# Patient Record
Sex: Male | Born: 1990 | Race: White | Hispanic: No | Smoking: Current every day smoker
Health system: Southern US, Community
[De-identification: ages and names within clinical notes are randomized; demographics above are authoritative.]

## PROBLEM LIST (undated history)

## (undated) ENCOUNTER — Emergency Department (HOSPITAL_COMMUNITY): Admission: EM | Payer: Self-pay | Source: Home / Self Care

## (undated) DIAGNOSIS — F419 Anxiety disorder, unspecified: Secondary | ICD-10-CM

## (undated) DIAGNOSIS — F32A Depression, unspecified: Secondary | ICD-10-CM

## (undated) DIAGNOSIS — Z720 Tobacco use: Secondary | ICD-10-CM

## (undated) DIAGNOSIS — N200 Calculus of kidney: Secondary | ICD-10-CM

## (undated) HISTORY — DX: Tobacco use: Z72.0

## (undated) HISTORY — DX: Calculus of kidney: N20.0

---

## 2017-11-28 ENCOUNTER — Emergency Department (HOSPITAL_COMMUNITY): Payer: Self-pay

## 2017-11-28 ENCOUNTER — Emergency Department (HOSPITAL_COMMUNITY)
Admission: EM | Admit: 2017-11-28 | Discharge: 2017-11-29 | Disposition: A | Discharge status: Home / Self Care | Payer: Self-pay | Source: Home / Self Care | Attending: Emergency Medicine | Admitting: Emergency Medicine

## 2017-11-28 ENCOUNTER — Encounter (HOSPITAL_COMMUNITY): Payer: Self-pay | Admitting: Emergency Medicine

## 2017-11-28 DIAGNOSIS — Y990 Civilian activity done for income or pay: Secondary | ICD-10-CM | POA: Insufficient documentation

## 2017-11-28 DIAGNOSIS — Z23 Encounter for immunization: Secondary | ICD-10-CM | POA: Insufficient documentation

## 2017-11-28 DIAGNOSIS — S61319A Laceration without foreign body of unspecified finger with damage to nail, initial encounter: Secondary | ICD-10-CM

## 2017-11-28 DIAGNOSIS — Y9289 Other specified places as the place of occurrence of the external cause: Secondary | ICD-10-CM

## 2017-11-28 DIAGNOSIS — Y9389 Activity, other specified: Secondary | ICD-10-CM | POA: Insufficient documentation

## 2017-11-28 DIAGNOSIS — S62634B Displaced fracture of distal phalanx of right ring finger, initial encounter for open fracture: Secondary | ICD-10-CM

## 2017-11-28 DIAGNOSIS — Z87891 Personal history of nicotine dependence: Secondary | ICD-10-CM

## 2017-11-28 DIAGNOSIS — W231XXA Caught, crushed, jammed, or pinched between stationary objects, initial encounter: Secondary | ICD-10-CM

## 2017-11-28 DIAGNOSIS — S6710XA Crushing injury of unspecified finger(s), initial encounter: Secondary | ICD-10-CM

## 2017-11-28 MED ORDER — CEFAZOLIN SODIUM-DEXTROSE 2-4 GM/100ML-% IV SOLN
2.0000 g | Freq: Once | INTRAVENOUS | Status: AC
  Administered 2017-11-28: 2 g via INTRAVENOUS
  Filled 2017-11-28: qty 100

## 2017-11-28 MED ORDER — BUPIVACAINE HCL (PF) 0.5 % IJ SOLN
10.0000 mL | Freq: Once | INTRAMUSCULAR | Status: AC
  Administered 2017-11-29: 10 mL

## 2017-11-28 MED ORDER — BUPIVACAINE HCL 0.25 % IJ SOLN
10.0000 mL | Freq: Once | INTRAMUSCULAR | Status: DC
  Filled 2017-11-28: qty 10

## 2017-11-28 MED ORDER — TETANUS-DIPHTH-ACELL PERTUSSIS 5-2.5-18.5 LF-MCG/0.5 IM SUSP
0.5000 mL | Freq: Once | INTRAMUSCULAR | Status: AC
  Administered 2017-11-28: 0.5 mL via INTRAMUSCULAR
  Filled 2017-11-28: qty 0.5

## 2017-11-28 MED ORDER — OXYCODONE-ACETAMINOPHEN 5-325 MG PO TABS
1.0000 | ORAL_TABLET | ORAL | Status: DC | PRN
  Administered 2017-11-28: 1 via ORAL
  Filled 2017-11-28: qty 1

## 2017-11-28 NOTE — ED Notes (Signed)
Patient transported to X-ray 

## 2017-11-28 NOTE — ED Triage Notes (Signed)
Reports getting right hand caught in a machine at work.  Lac to right ring finger.  Bleeding controlled.

## 2017-11-29 ENCOUNTER — Other Ambulatory Visit: Payer: Self-pay

## 2017-11-29 ENCOUNTER — Encounter (HOSPITAL_COMMUNITY): Payer: Self-pay | Admitting: *Deleted

## 2017-11-29 ENCOUNTER — Emergency Department (HOSPITAL_COMMUNITY): Payer: Worker's Compensation | Admitting: Anesthesiology

## 2017-11-29 ENCOUNTER — Ambulatory Visit (HOSPITAL_COMMUNITY)
Admission: EM | Admit: 2017-11-29 | Discharge: 2017-11-29 | Disposition: A | Discharge status: Home / Self Care | Payer: Worker's Compensation | Attending: Emergency Medicine | Admitting: Emergency Medicine

## 2017-11-29 ENCOUNTER — Encounter (HOSPITAL_COMMUNITY)
Admission: EM | Disposition: A | Discharge status: Home / Self Care | Payer: Self-pay | Source: Home / Self Care | Attending: Emergency Medicine

## 2017-11-29 DIAGNOSIS — S62634B Displaced fracture of distal phalanx of right ring finger, initial encounter for open fracture: Secondary | ICD-10-CM | POA: Insufficient documentation

## 2017-11-29 DIAGNOSIS — W230XXA Caught, crushed, jammed, or pinched between moving objects, initial encounter: Secondary | ICD-10-CM | POA: Insufficient documentation

## 2017-11-29 DIAGNOSIS — S67194A Crushing injury of right ring finger, initial encounter: Secondary | ICD-10-CM | POA: Insufficient documentation

## 2017-11-29 DIAGNOSIS — S61319A Laceration without foreign body of unspecified finger with damage to nail, initial encounter: Secondary | ICD-10-CM

## 2017-11-29 DIAGNOSIS — Y990 Civilian activity done for income or pay: Secondary | ICD-10-CM | POA: Insufficient documentation

## 2017-11-29 DIAGNOSIS — Z87891 Personal history of nicotine dependence: Secondary | ICD-10-CM | POA: Insufficient documentation

## 2017-11-29 HISTORY — PX: I&D EXTREMITY: SHX5045

## 2017-11-29 HISTORY — PX: OPEN REDUCTION INTERNAL FIXATION (ORIF) FINGER WITH RADIAL BONE GRAFT: SHX5666

## 2017-11-29 SURGERY — IRRIGATION AND DEBRIDEMENT EXTREMITY
Anesthesia: Monitor Anesthesia Care | Site: Finger | Laterality: Right

## 2017-11-29 MED ORDER — PHENYLEPHRINE 40 MCG/ML (10ML) SYRINGE FOR IV PUSH (FOR BLOOD PRESSURE SUPPORT)
PREFILLED_SYRINGE | INTRAVENOUS | Status: AC
  Filled 2017-11-29: qty 10

## 2017-11-29 MED ORDER — BUPIVACAINE HCL (PF) 0.25 % IJ SOLN
INTRAMUSCULAR | Status: AC
Start: 2017-11-29 — End: 2017-11-29
  Filled 2017-11-29: qty 30

## 2017-11-29 MED ORDER — PHENYLEPHRINE 40 MCG/ML (10ML) SYRINGE FOR IV PUSH (FOR BLOOD PRESSURE SUPPORT)
PREFILLED_SYRINGE | INTRAVENOUS | Status: DC | PRN
  Administered 2017-11-29 (×4): 80 ug via INTRAVENOUS

## 2017-11-29 MED ORDER — PROPOFOL 1000 MG/100ML IV EMUL
INTRAVENOUS | Status: AC
Start: 2017-11-29 — End: 2017-11-29
  Filled 2017-11-29: qty 100

## 2017-11-29 MED ORDER — MIDAZOLAM HCL 2 MG/2ML IJ SOLN
INTRAMUSCULAR | Status: AC
  Filled 2017-11-29: qty 2

## 2017-11-29 MED ORDER — 0.9 % SODIUM CHLORIDE (POUR BTL) OPTIME
TOPICAL | Status: DC | PRN
  Administered 2017-11-29: 1000 mL

## 2017-11-29 MED ORDER — ONDANSETRON HCL 4 MG/2ML IJ SOLN
INTRAMUSCULAR | Status: DC | PRN
  Administered 2017-11-29: 4 mg via INTRAVENOUS

## 2017-11-29 MED ORDER — CEFAZOLIN SODIUM-DEXTROSE 2-4 GM/100ML-% IV SOLN
2.0000 g | INTRAVENOUS | Status: DC
  Filled 2017-11-29: qty 100

## 2017-11-29 MED ORDER — FENTANYL CITRATE (PF) 100 MCG/2ML IJ SOLN
INTRAMUSCULAR | Status: DC | PRN
  Administered 2017-11-29 (×2): 50 ug via INTRAVENOUS

## 2017-11-29 MED ORDER — BUPIVACAINE HCL (PF) 0.25 % IJ SOLN
INTRAMUSCULAR | Status: DC | PRN
  Administered 2017-11-29: 5 mL

## 2017-11-29 MED ORDER — MIDAZOLAM HCL 5 MG/5ML IJ SOLN
INTRAMUSCULAR | Status: DC | PRN
  Administered 2017-11-29: 2 mg via INTRAVENOUS

## 2017-11-29 MED ORDER — CHLORHEXIDINE GLUCONATE 4 % EX LIQD: 60.0000 mL | Freq: Once | CUTANEOUS | Status: DC

## 2017-11-29 MED ORDER — LIDOCAINE HCL (PF) 1 % IJ SOLN
INTRAMUSCULAR | Status: AC
  Filled 2017-11-29: qty 30

## 2017-11-29 MED ORDER — LIDOCAINE HCL (PF) 4 % IJ SOLN
INTRAMUSCULAR | Status: AC
  Filled 2017-11-29: qty 5

## 2017-11-29 MED ORDER — OXYCODONE HCL 5 MG PO TABS: 5.0000 mg | ORAL_TABLET | Freq: Four times a day (QID) | ORAL | 0 refills | Status: DC | PRN

## 2017-11-29 MED ORDER — CEPHALEXIN 500 MG PO CAPS: 500.0000 mg | ORAL_CAPSULE | Freq: Two times a day (BID) | ORAL | 0 refills | Status: AC

## 2017-11-29 MED ORDER — LIDOCAINE HCL (PF) 1 % IJ SOLN
INTRAMUSCULAR | Status: DC | PRN
  Administered 2017-11-29: 5 mL

## 2017-11-29 MED ORDER — ONDANSETRON HCL 4 MG/2ML IJ SOLN
INTRAMUSCULAR | Status: AC
  Filled 2017-11-29: qty 2

## 2017-11-29 MED ORDER — FENTANYL CITRATE (PF) 250 MCG/5ML IJ SOLN
INTRAMUSCULAR | Status: AC
  Filled 2017-11-29: qty 5

## 2017-11-29 MED ORDER — PROPOFOL 500 MG/50ML IV EMUL
INTRAVENOUS | Status: DC | PRN
  Administered 2017-11-29: 125 ug/kg/min via INTRAVENOUS

## 2017-11-29 MED ORDER — CEPHALEXIN 500 MG PO CAPS: 500.0000 mg | ORAL_CAPSULE | Freq: Four times a day (QID) | ORAL | 0 refills | Status: AC

## 2017-11-29 MED ORDER — PROPOFOL 10 MG/ML IV BOLUS
INTRAVENOUS | Status: AC
  Filled 2017-11-29: qty 20

## 2017-11-29 MED ORDER — OXYCODONE HCL 5 MG PO TABS: 5.0000 mg | ORAL_TABLET | ORAL | 0 refills | Status: DC | PRN

## 2017-11-29 MED ORDER — LACTATED RINGERS IV SOLN
INTRAVENOUS | Status: DC
  Administered 2017-11-29: 10:00:00 via INTRAVENOUS

## 2017-11-29 SURGICAL SUPPLY — 32 items
BNDG COHESIVE 1X5 TAN STRL LF (GAUZE/BANDAGES/DRESSINGS) ×3 IMPLANT
BNDG CONFORM 2 STRL LF (GAUZE/BANDAGES/DRESSINGS) ×3 IMPLANT
BNDG ESMARK 4X9 LF (GAUZE/BANDAGES/DRESSINGS) ×3 IMPLANT
CORDS BIPOLAR (ELECTRODE) ×3 IMPLANT
COVER SURGICAL LIGHT HANDLE (MISCELLANEOUS) ×3 IMPLANT
CUFF TOURNIQUET SINGLE 18IN (TOURNIQUET CUFF) ×3 IMPLANT
DRAPE SURG 17X23 STRL (DRAPES) ×3 IMPLANT
GAUZE SPONGE 4X4 12PLY STRL (GAUZE/BANDAGES/DRESSINGS) ×3 IMPLANT
GAUZE XEROFORM 1X8 LF (GAUZE/BANDAGES/DRESSINGS) ×3 IMPLANT
GLOVE BIOGEL PI IND STRL 8.5 (GLOVE) ×1 IMPLANT
GLOVE BIOGEL PI INDICATOR 8.5 (GLOVE) ×2
GLOVE SURG ORTHO 8.0 STRL STRW (GLOVE) ×3 IMPLANT
GOWN STRL REUS W/ TWL LRG LVL3 (GOWN DISPOSABLE) ×1 IMPLANT
GOWN STRL REUS W/ TWL XL LVL3 (GOWN DISPOSABLE) ×1 IMPLANT
GOWN STRL REUS W/TWL LRG LVL3 (GOWN DISPOSABLE) ×2
GOWN STRL REUS W/TWL XL LVL3 (GOWN DISPOSABLE) ×2
K-WIRE .045X4 (WIRE) ×3 IMPLANT
KIT BASIN OR (CUSTOM PROCEDURE TRAY) ×3 IMPLANT
KIT ROOM TURNOVER OR (KITS) ×3 IMPLANT
NEEDLE HYPO 25GX1X1/2 BEV (NEEDLE) ×3 IMPLANT
NS IRRIG 1000ML POUR BTL (IV SOLUTION) ×3 IMPLANT
PACK ORTHO EXTREMITY (CUSTOM PROCEDURE TRAY) ×3 IMPLANT
PAD ARMBOARD 7.5X6 YLW CONV (MISCELLANEOUS) ×3 IMPLANT
SOAP 2 % CHG 4 OZ (WOUND CARE) ×3 IMPLANT
SPLINT FINGER (SOFTGOODS) ×3 IMPLANT
SUCTION FRAZIER HANDLE 10FR (MISCELLANEOUS) ×2
SUCTION TUBE FRAZIER 10FR DISP (MISCELLANEOUS) ×1 IMPLANT
SUT CHROMIC 5 0 P 3 (SUTURE) ×3 IMPLANT
SUT PROLENE 5 0 PS 2 (SUTURE) ×3 IMPLANT
SYR CONTROL 10ML LL (SYRINGE) ×3 IMPLANT
TOWEL OR 17X26 10 PK STRL BLUE (TOWEL DISPOSABLE) ×3 IMPLANT
UNDERPAD 30X30 (UNDERPADS AND DIAPERS) ×3 IMPLANT

## 2017-11-29 NOTE — ED Provider Notes (Signed)
[facility] EMERGENCY DEPARTMENT Provider Note   CSN: [id number] Arrival date & time: 11/28/17  2038     History   Chief Complaint Chief Complaint  Patient presents with  . Hand Injury    HPI RAD GRAMLING is a 27 y.o. male.  HPI   27 year old right-handed male presents with concern for crush injury and laceration to his right distal ring finger after it was caught in a machine at work.  Patient reports the incident occurred around 6:30 PM.  Reports he has tingling to the tip of his right ring finger.  Reports he is not sure if he is up-to-date on tetanus vaccinations.  Has had a history of prior injury to the right pinky finger.  Denies any other injuries.  History reviewed. No pertinent past medical history.  There are no active problems to display for this patient.   History reviewed. No pertinent surgical history.     Home Medications    Prior to Admission medications   Medication Sig Start Date End Date Taking? Authorizing Provider  Acetaminophen (TYLENOL PO) Take 1 tablet by mouth once.   Yes [provider]  cephALEXin (KEFLEX) 500 MG capsule Take 1 capsule (500 mg total) by mouth 2 (two) times daily for 7 days. 11/29/17 12/06/17  Alvira Monday, MD  oxyCODONE (ROXICODONE) 5 MG immediate release tablet Take 1 tablet (5 mg total) by mouth every 6 (six) hours as needed for severe pain. 11/29/17   Alvira Monday, MD    Family History No family history on file.  Social History Social History   Tobacco Use  . Smoking status: Former Games developer  . Smokeless tobacco: Never Used  Substance Use Topics  . Alcohol use: Yes    Comment: occasionally  . Drug use: Yes    Types: Methamphetamines    Comment: cbd oild     Allergies   Patient has no known allergies.   Review of Systems Review of Systems  Constitutional: Negative for fever.  HENT: Negative for sore throat.   Eyes: Negative for visual disturbance.  Respiratory: Negative for  shortness of breath.   Cardiovascular: Negative for chest pain.  Gastrointestinal: Negative for abdominal pain.  Musculoskeletal: Positive for arthralgias.  Skin: Positive for wound.  Neurological: Positive for numbness. Negative for syncope.     Physical Exam Updated Vital Signs BP (!) 126/106   Pulse 72   Temp 98.6 F (37 C) (Oral)   Resp 16   Ht 6' (1.829 m)   Wt 81.6 kg (180 lb)   SpO2 98%   BMI 24.41 kg/m   Physical Exam  Constitutional: He is oriented to person, place, and time. He appears well-developed and well-nourished. No distress.  HENT:  Head: Normocephalic and atraumatic.  Eyes: Conjunctivae and EOM are normal.  Neck: Normal range of motion.  Cardiovascular: Normal rate, regular rhythm and intact distal pulses.  Pulmonary/Chest: Effort normal. No respiratory distress.  Musculoskeletal: He exhibits no edema.       Right hand: He exhibits deformity and laceration. He exhibits normal capillary refill. Decreased sensation (to tip of ring finger, reports tingling) noted.  Crush injury to right ring finger Laceration through mid-nail with nail plate lifted through another laceration and laceration extending around side.  Tip of finger with cap refill, normal coloring.  Neurological: He is alert and oriented to person, place, and time.  Skin: Skin is warm and dry. He is not diaphoretic.  Nursing note and vitals reviewed.  ED Treatments / Results  Labs (all labs ordered are listed, but only abnormal results are displayed) Labs Reviewed - No data to display  EKG  EKG Interpretation None       Radiology Dg Finger Ring Right  Result Date: 11/28/2017 CLINICAL DATA:  27 y/o M; laceration to right ring finger after machine injury. EXAM: RIGHT RING FINGER 2+V COMPARISON:  None. FINDINGS: Comminuted acute fracture of the fourth distal phalanx tuft and distal shaft with soft tissue laceration. No joint dislocation. No other fracture identified. IMPRESSION:  Comminuted acute fracture of the fourth distal phalanx tuft and distal shaft with soft tissue laceration. No joint dislocation. Electronically Signed   By: Mitzi HansenLance  Furusawa-Stratton M.D.   On: 11/28/2017 21:41    Procedures Procedures (including critical care time)  Medications Ordered in ED Medications  oxyCODONE-acetaminophen (PERCOCET/ROXICET) 5-325 MG per tablet 1 tablet (1 tablet Oral Given 11/28/17 2049)  Tdap (BOOSTRIX) injection 0.5 mL (0.5 mLs Intramuscular Given 11/28/17 2250)  ceFAZolin (ANCEF) IVPB 2g/100 mL premix (0 g Intravenous Stopped 11/29/17 0000)  bupivacaine (MARCAINE) 0.5 % injection 10 mL (10 mLs Infiltration Given by Other 11/29/17 0128)     Initial Impression / Assessment and Plan / ED Course  I have reviewed the triage vital signs and the nursing notes.  Pertinent labs & imaging results that were available during my care of the patient were reviewed by me and considered in my medical decision making (see chart for details).     27 year old right-handed male presents with concern for crush injury and laceration to his right distal ring finger after it was caught in a machine at work.  X-ray shows distal phalanx and tuft fracture of the right ring finger.  Patient with significant lacerations, nailbed injury, and feel the degree of lacerations and injury are most appropriate for hand surgery repair.  Pt with normal capillary refill.   Given ancef and tetanus vaccination.  Discussed with Dr. Melvyn Novasrtmann who recommends the patient be discharged andd return to the emergency department at 8 AM for his evaluation and treatment.   Placed digital block with marcaine. Irrigated finger and placed xeroform dressing, bleeding controlled.  Gave rx for keflex and 8 tablets of oxycodone and recommend tylenol/ibuprofen primarily for pain.  Told to return in morning for eval by Dr. Melvyn Novasrtmann. Please page Dr. Melvyn Novasrtmann on arrival to ED.     Final Clinical Impressions(s) / ED Diagnoses   Final  diagnoses:  Open displaced fracture of distal phalanx of right ring finger, initial encounter  Crushing injury of finger, initial encounter  Laceration of nail bed of finger, initial encounter    ED Discharge Orders        Ordered    oxyCODONE (ROXICODONE) 5 MG immediate release tablet  Every 6 hours PRN     11/29/17 0037    cephALEXin (KEFLEX) 500 MG capsule  2 times daily     11/29/17 0037       Alvira MondaySchlossman, Taiki Buckwalter, MD 11/29/17 0157

## 2017-11-29 NOTE — Anesthesia Procedure Notes (Signed)
Procedure Name: MAC Date/Time: 11/29/2017 10:46 AM Performed by: Kyung Rudd, CRNA Pre-anesthesia Checklist: Patient identified, Emergency Drugs available, Suction available and Patient being monitored Patient Re-evaluated:Patient Re-evaluated prior to induction Oxygen Delivery Method: Simple face mask Induction Type: IV induction Placement Confirmation: positive ETCO2

## 2017-11-29 NOTE — Anesthesia Postprocedure Evaluation (Signed)
Anesthesia Post Note  Patient: [name]  Procedure(s) Performed: IRRIGATION AND DEBRIDEMENT RING FINGER REPAIR (Right Finger) OPEN REDUCTION INTERNAL FIXATION (ORIF) FINGER (Right Finger)     Patient location during evaluation: PACU Anesthesia Type: MAC Level of consciousness: awake and alert Pain management: pain level controlled Vital Signs Assessment: post-procedure vital signs reviewed and stable Respiratory status: spontaneous breathing, nonlabored ventilation, respiratory function stable and patient connected to nasal cannula oxygen Cardiovascular status: stable and blood pressure returned to baseline Postop Assessment: no apparent nausea or vomiting Anesthetic complications: no    Last Vitals:  Vitals:   11/29/17 1145 11/29/17 1200  BP: 112/75 110/74  Pulse: 72 60  Resp: 14 15  Temp:  36.6 C  SpO2: 99% 99%    Last Pain:  Vitals:   11/29/17 0824  TempSrc: Oral                 Derek Quinn

## 2017-11-29 NOTE — ED Triage Notes (Signed)
Pt in after crush injury to his right hand last night, seen last night and discharged, told to return this am to see Dr. Melvyn Novasrtmann. Pt denies pain at this time.

## 2017-11-29 NOTE — H&P (Signed)
Derek Quinn is an 27 y.o. male.   Chief Complaint: Right ring finger injury HPI: 27 year old right-handed male presents with concern for crush injury and laceration to his right distal ring finger after it was caught in a machine at work.  Patient reports the incident occurred around 6:30 PM.  Reports he has tingling to the tip of his right ring finger.  Reports he is not sure if he is up-to-date on tetanus vaccinations.  Has had a history of prior injury to the right pinky finger.  Denies any other injuries.TD given in ED  History reviewed. No pertinent past medical history.  History reviewed. No pertinent surgical history.  History reviewed. No pertinent family history. Social History:  reports that he has quit smoking. he has never used smokeless tobacco. He reports that he drinks alcohol. He reports that he uses drugs. Drug: Methamphetamines.  Allergies: No Known Allergies  Medications Prior to Admission  Medication Sig Dispense Refill  . cephALEXin (KEFLEX) 500 MG capsule Take 1 capsule (500 mg total) by mouth 2 (two) times daily for 7 days. (Patient not taking: Reported on 11/29/2017) 14 capsule 0  . oxyCODONE (ROXICODONE) 5 MG immediate release tablet Take 1 tablet (5 mg total) by mouth every 6 (six) hours as needed for severe pain. (Patient not taking: Reported on 11/29/2017) 8 tablet 0    No results found for this or any previous visit (from the past 48 hour(s)). Dg Finger Ring Right  Result Date: 11/28/2017 CLINICAL DATA:  27 y/o M; laceration to right ring finger after machine injury. EXAM: RIGHT RING FINGER 2+V COMPARISON:  None. FINDINGS: Comminuted acute fracture of the fourth distal phalanx tuft and distal shaft with soft tissue laceration. No joint dislocation. No other fracture identified. IMPRESSION: Comminuted acute fracture of the fourth distal phalanx tuft and distal shaft with soft tissue laceration. No joint dislocation. Electronically Signed   By: Mitzi HansenLance  Furusawa-Stratton  M.D.   On: 11/28/2017 21:41    ROS : NO RECENT ILLNESSES OR HOSPITALIZATIONS  Blood pressure 112/76, pulse 83, temperature 97.7 F (36.5 C), temperature source Oral, resp. rate 18, height 6' (1.829 m), weight 180 lb (81.6 kg), SpO2 97 %. Physical Exam  General Appearance:  Alert, cooperative, no distress, appears stated age  Head:  Normocephalic, without obvious abnormality, atraumatic  Eyes:  Pupils equal, conjunctiva/corneas clear,         Throat: Lips, mucosa, and tongue normal; teeth and gums normal  Neck: No visible masses     Lungs:   respirations unlabored  Chest Wall:  No tenderness or deformity  Heart:  Regular rate and rhythm,  Abdomen:   Soft, non-tender,         Extremities: RIGHT HAND: SIGNIFICANT NAIL BED INJURY TO RIGHT RING FINGER, FINGER WARM WELL PERFUSED GOOD CAP REFIL NO WOUNDS TO LONG/INDEX/SMALL   Pulses: 2+ and symmetric  Skin: Skin color, texture, turgor normal, no rashes or lesions     Neurologic: Normal    Assessment/Plan RIGHT RING OPEN DISTAL PHALANX FRACTURE WITH SOFT TISSUE DISARRAY  RIGHT RING FINGER OPEN REDUCTION AND REPAIR AS INDICATED  R/B/A DISCUSSED WITH PT IN HOSPITAL  PT VOICED UNDERSTANDING OF PLAN CONSENT SIGNED DAY OF SURGERY PT SEEN AND EXAMINED PRIOR TO OPERATIVE PROCEDURE/DAY OF SURGERY SITE MARKED. QUESTIONS ANSWERED WILL GO HOME FOLLOWING SURGERY   WE ARE PLANNING SURGERY FOR YOUR UPPER EXTREMITY. THE RISKS AND BENEFITS OF SURGERY INCLUDE BUT NOT LIMITED TO BLEEDING INFECTION, DAMAGE TO NEARBY NERVES ARTERIES TENDONS,  FAILURE OF SURGERY TO ACCOMPLISH ITS INTENDED GOALS, PERSISTENT SYMPTOMS AND NEED FOR FURTHER SURGICAL INTERVENTION. WITH THIS IN MIND WE WILL PROCEED. I HAVE DISCUSSED WITH THE PATIENT THE PRE AND POSTOPERATIVE REGIMEN AND THE DOS AND DON'TS. PT VOICED UNDERSTANDING AND INFORMED CONSENT SIGNED.  Derek Quinn 11/29/2017, 10:33 AM

## 2017-11-29 NOTE — ED Notes (Signed)
Report given to Robbie RN.

## 2017-11-29 NOTE — Anesthesia Preprocedure Evaluation (Addendum)
Anesthesia Evaluation  Patient identified by MRN, date of birth, ID band Patient awake    Reviewed: Allergy & Precautions, NPO status , Patient's Chart, lab work & pertinent test results  History of Anesthesia Complications Negative for: history of anesthetic complications  Airway Mallampati: I  TM Distance: >3 FB Neck ROM: Full    Dental  (+) Teeth Intact, Dental Advisory Given   Pulmonary neg shortness of breath, neg sleep apnea, neg COPD, neg recent URI, former smoker,    breath sounds clear to auscultation       Cardiovascular negative cardio ROS   Rhythm:Regular     Neuro/Psych negative neurological ROS  negative psych ROS   GI/Hepatic negative GI ROS, Neg liver ROS,   Endo/Other  negative endocrine ROS  Renal/GU negative Renal ROS     Musculoskeletal   Abdominal   Peds  Hematology negative hematology ROS (+)   Anesthesia Other Findings   Reproductive/Obstetrics                            Anesthesia Physical Anesthesia Plan  ASA: I  Anesthesia Plan: MAC   Post-op Pain Management:    Induction: Intravenous  PONV Risk Score and Plan: 1 and Ondansetron  Airway Management Planned: Nasal Cannula  Additional Equipment: None  Intra-op Plan:   Post-operative Plan:   Informed Consent: I have reviewed the patients History and Physical, chart, labs and discussed the procedure including the risks, benefits and alternatives for the proposed anesthesia with the patient or authorized representative who has indicated his/her understanding and acceptance.   Dental advisory given  Plan Discussed with: CRNA and Surgeon  Anesthesia Plan Comments:         Anesthesia Quick Evaluation

## 2017-11-29 NOTE — Discharge Instructions (Signed)
KEEP BANDAGE CLEAN AND DRY CALL OFFICE FOR F/U APPT [phone] IN 6 DAYS NO USE OF RIGHT RING FINGER  KEEP HAND ELEVATED ABOVE HEART OK TO APPLY ICE TO OPERATIVE AREA CONTACT OFFICE IF ANY WORSENING PAIN OR CONCERNS.

## 2017-11-29 NOTE — Transfer of Care (Signed)
Immediate Anesthesia Transfer of Care Note  Patient: Derek Quinn  Procedure(s) Performed: IRRIGATION AND DEBRIDEMENT RING FINGER REPAIR (Right Finger)  Patient Location: PACU  Anesthesia Type:MAC  Level of Consciousness: awake, alert  and oriented  Airway & Oxygen Therapy: Patient Spontanous Breathing and Patient connected to face mask oxygen  Post-op Assessment: Report given to RN, Post -op Vital signs reviewed and stable and Patient moving all extremities  Post vital signs: Reviewed and stable  Last Vitals:  Vitals:   11/29/17 0824 11/29/17 1139  BP: 112/76 121/65  Pulse: 83 71  Resp: 18 (!) 9  Temp: 36.5 C (!) 36.4 C  SpO2: 97% 100%    Last Pain:  Vitals:   11/29/17 0824  TempSrc: Oral         Complications: No apparent anesthesia complications

## 2017-11-29 NOTE — Op Note (Signed)
PREOPERATIVE DIAGNOSIS: Right ring finger open distal phalanx fracture  POSTOPERATIVE DIAGNOSIS: Same  ATTENDING SURGEON: Dr. Bradly BienenstockFred Elisheba Mcdonnell who was scrubbed and present for the entire procedure  ASSISTANT SURGEON: None  ANESTHESIA: 1% Xylocaine quarter percent Marcaine local block with IV sedation  OPERATIVE PROCEDURE: #1: Open treatment of right ring finger distal phalanx fracture requiring internal fixation #2: Open debridement of right ring finger open distal phalanx fracture with excisional debridement of skin subcutaneous tissue and bone #3: Repair right ring finger nail bed #4: Traumatic wound laceration repair, 1 cm and separate wound measuring 1 cm #5: Radiographs 3 views right ring finger   IMPLANTS:1 0.045 K wire   RADIOGRAPHIC INTERPRETATION:AP lateral oblique views of the finger do show the K wire fixation across the distal interphalangeal joint with good position   SURGICAL INDICATIONS:Patient is a right-hand-dominant gentleman sustained the open distal phalanx fracture. Patient was seen and evaluated in the hospital recommended undergo the above procedure. Risks benefits and alternatives were discussed in detail with the patient in a signed informed consent was obtained. Risks include but not limited to bleeding infection damage to nearby nerves arteries or tendons loss of motion of the wrists and digits incomplete relief of symptoms and need for further surgical intervention   SURGICAL TECHNIQUE:Patient is properly identified in the preoperative holding area and a mark with a permanent marker made on the right ring finger to indicate correct operative site. Patient then brought back to operating room placed supine on anesthesia and table where the IV sedation was administered. The local anesthetic was administered. A well-padded tourniquet was then placed on the right forearm and sealed with the appropriate drape. The right upper extremity is then prepped and draped in normal  sterile fashion. Timeout was called correct site  was identified and the procedure was then begun. Attention was then turned to the ring finger. What remained of the nail plate was then removed with a small Therapist, nutritionalreer elevator. The fracture site was then opened up and excisional debridement of skin subcutaneous tissue and bone was then carried out sharply with a small curettes and knife. The wound was then thoroughly irrigated thorough irrigation was then done of the fracture site. After wound irrigation the fracture was then reduced. A 0.045 K wire was then placed retrograde across the fracture site in the distal interphalangeal joint for additional purchase. Following this after open treatment of the fracture the nail bed was then repaired using 5-0 chromic sutures. The traumatic laceration was then repaired 2 separate lacerations to the finger which were repaired with simple Prolene sutures. Portion the nail plate was then placed beneath the eponychia to protect the repair of the nailbed. Tourniquet deflated good perfusion of the finger. Xeroform dressing a sterile compressive bandage then applied. Patient is then placed in a finger splint taken recovery room in good condition.   POSTOPERATIVE PLAN: Patient be discharged to home seen back now office in 6 days for wound check down to see her therapist for a tip protector splint see him back at the four-week mark for suture removal and pin removal begin some more aggressive therapy at that next visit

## 2017-11-29 NOTE — ED Provider Notes (Signed)
[facility] EMERGENCY DEPARTMENT Provider Note   CSN: [id number] Arrival date & time: 11/29/17  4098     History   Chief Complaint Chief Complaint  Patient presents with  . Hand Injury    HPI Derek Quinn is a 27 y.o. male presents today for follow-up of right ring finger injury.He sustained a crush injury and laceration to his right distal ring finger after it was caught in a machine at work.  Radiographs show a comminuted acute fracture of the fourth distal phalanx tuft and distal shaft with overlying laceration.Patient received a digital block in the ED yesterday  With significant relief of pain.  He denies excessive bleeding through the dressing, fevers, or chills.  Given degree of lacerations and nail bed injury, Dr. Orlan Leavens with hand surgery was consulted who recommended discharge and return in the morning for repair.  The history is provided by the patient.    History reviewed. No pertinent past medical history.  There are no active problems to display for this patient.   History reviewed. No pertinent surgical history.     Home Medications    Prior to Admission medications   Medication Sig Start Date End Date Taking? Authorizing Provider  cephALEXin (KEFLEX) 500 MG capsule Take 1 capsule (500 mg total) by mouth 2 (two) times daily for 7 days. Patient not taking: Reported on 11/29/2017 11/29/17 12/06/17  Alvira Monday, MD  oxyCODONE (ROXICODONE) 5 MG immediate release tablet Take 1 tablet (5 mg total) by mouth every 6 (six) hours as needed for severe pain. Patient not taking: Reported on 11/29/2017 11/29/17   Alvira Monday, MD    Family History History reviewed. No pertinent family history.  Social History Social History   Tobacco Use  . Smoking status: Former Games developer  . Smokeless tobacco: Never Used  Substance Use Topics  . Alcohol use: Yes    Comment: occasionally  . Drug use: Yes    Types: Methamphetamines    Comment: cbd oild      Allergies   Patient has no known allergies.   Review of Systems Review of Systems  Constitutional: Negative for chills and fever.  Musculoskeletal: Positive for arthralgias.  Skin: Positive for wound.  Neurological: Positive for numbness (secondary to digital block).     Physical Exam Updated Vital Signs BP 112/76 (BP Location: Left Arm)   Pulse 83   Temp 97.7 F (36.5 C) (Oral)   Resp 18   Ht 6' (1.829 m)   Wt 81.6 kg (180 lb)   SpO2 97%   BMI 24.41 kg/m   Physical Exam  Constitutional: He appears well-developed and well-nourished. No distress.  HENT:  Head: Normocephalic and atraumatic.  Eyes: Conjunctivae are normal. Right eye exhibits no discharge. Left eye exhibits no discharge.  Neck: No JVD present. No tracheal deviation present.  Cardiovascular: Normal rate and intact distal pulses.  2+ radial pulses bilaterally  Pulmonary/Chest: Effort normal.  Abdominal: He exhibits no distension.  Musculoskeletal: He exhibits no edema.  Large dressing and finger splint applied to the right fourth digit.  5/5 strength of wrist and digits with flexion and extension against resistance.  Good grip strength bilaterally.  Neurological: He is alert.  Fluent speech, no facial droop, sensation intact to soft touch of bilateral hands  Skin: Skin is warm and dry. Capillary refill takes less than 2 seconds. No erythema.  Psychiatric: He has a normal mood and affect. His behavior is normal.  Nursing note and vitals reviewed.  ED Treatments / Results  Labs (all labs ordered are listed, but only abnormal results are displayed) Labs Reviewed - No data to display  EKG  EKG Interpretation None       Radiology Dg Finger Ring Right  Result Date: 11/28/2017 CLINICAL DATA:  27 y/o M; laceration to right ring finger after machine injury. EXAM: RIGHT RING FINGER 2+V COMPARISON:  None. FINDINGS: Comminuted acute fracture of the fourth distal phalanx tuft and distal shaft with  soft tissue laceration. No joint dislocation. No other fracture identified. IMPRESSION: Comminuted acute fracture of the fourth distal phalanx tuft and distal shaft with soft tissue laceration. No joint dislocation. Electronically Signed   By: Mitzi HansenLance  Furusawa-Stratton M.D.   On: 11/28/2017 21:41    Procedures Procedures (including critical care time)  Medications Ordered in ED Medications - No data to display   Initial Impression / Assessment and Plan / ED Course  I have reviewed the triage vital signs and the nursing notes.  Pertinent labs & imaging results that were available during my care of the patient were reviewed by me and considered in my medical decision making (see chart for details).     Patient presents for reevaluation of right ring finger injury as instructed.  Afebrile, vital signs are stable.  He is intact in no apparent distress.  Wound was dressed appropriately and splint remains in place.  No evidence of compartment syndrome or secondary infection.  Spoke with Dr. Orlan Leavensrtman who states that he will take the patient to the operating room for repair.  Patient has been n.p.o. since yesterday afternoon.  Final Clinical Impressions(s) / ED Diagnoses   Final diagnoses:  Crushing injury of right ring finger, initial encounter  Open displaced fracture of distal phalanx of right ring finger, initial encounter  Laceration of nail bed of finger, initial encounter    ED Discharge Orders    None       Jeanie SewerFawze, Dashawn Bartnick A, PA-C 11/29/17 1621    Arby BarrettePfeiffer, Marcy, MD 11/29/17 1730

## 2017-12-01 ENCOUNTER — Encounter (HOSPITAL_COMMUNITY): Payer: Self-pay | Admitting: Orthopedic Surgery

## 2017-12-02 ENCOUNTER — Encounter (HOSPITAL_COMMUNITY): Payer: Self-pay | Admitting: Orthopedic Surgery

## 2018-08-31 IMAGING — DX DG FINGER RING 2+V*R*
3 series · 3 of 3 positions shown · non-contrast
Comparison: None.

CLINICAL DATA: 26 y/o M; laceration to right ring finger after
machine injury.

EXAM:
RIGHT RING FINGER 2+V

[finger ap]
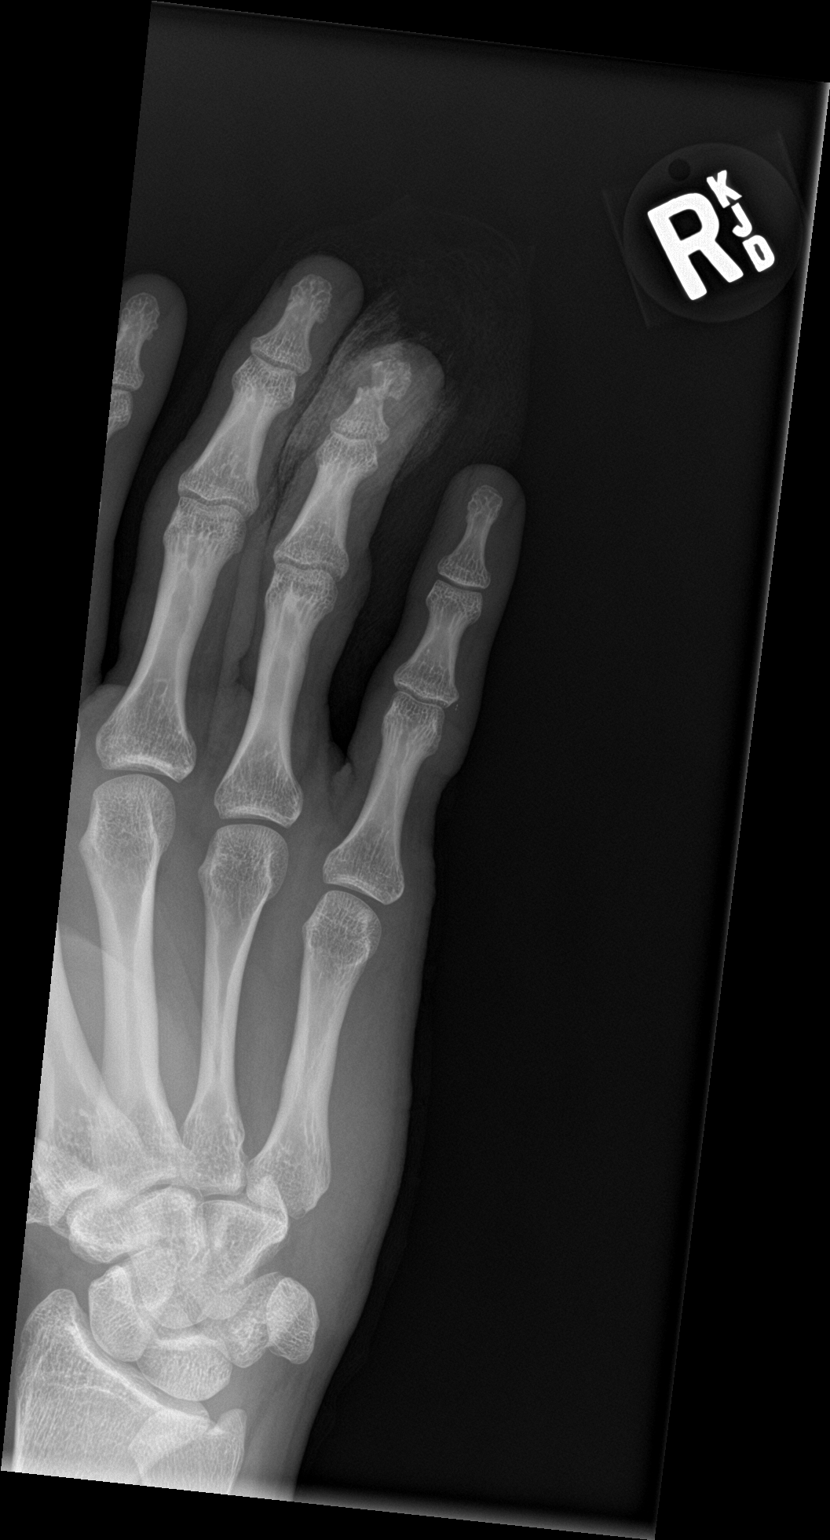

[finger obl]
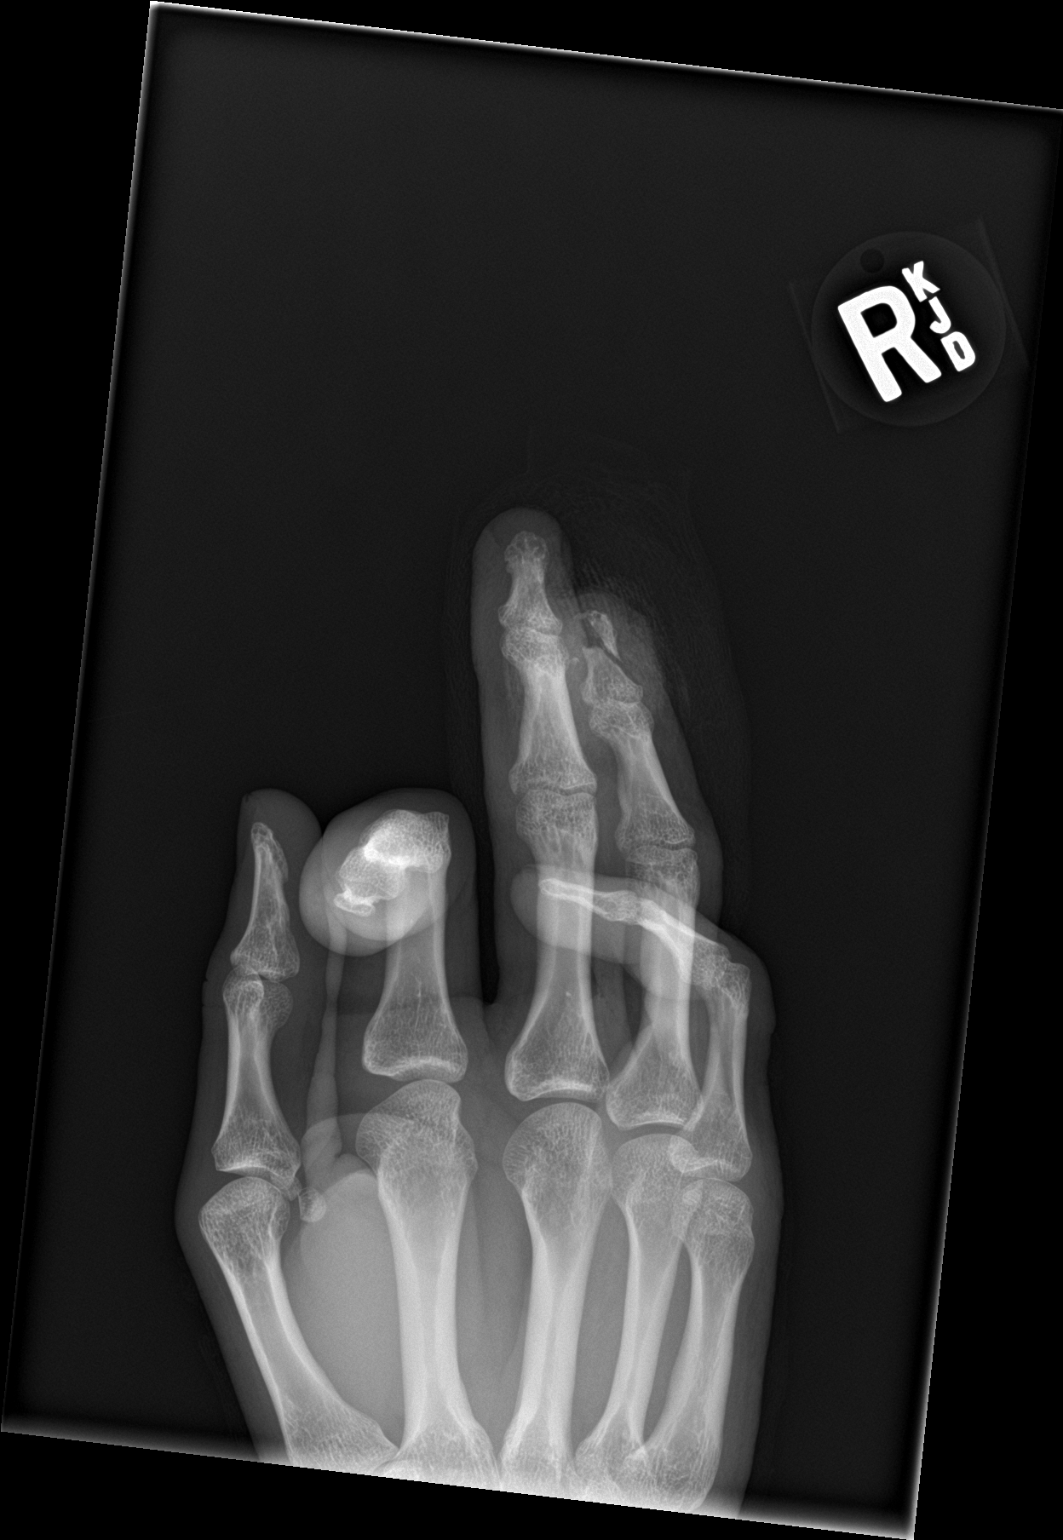

[finger lat]
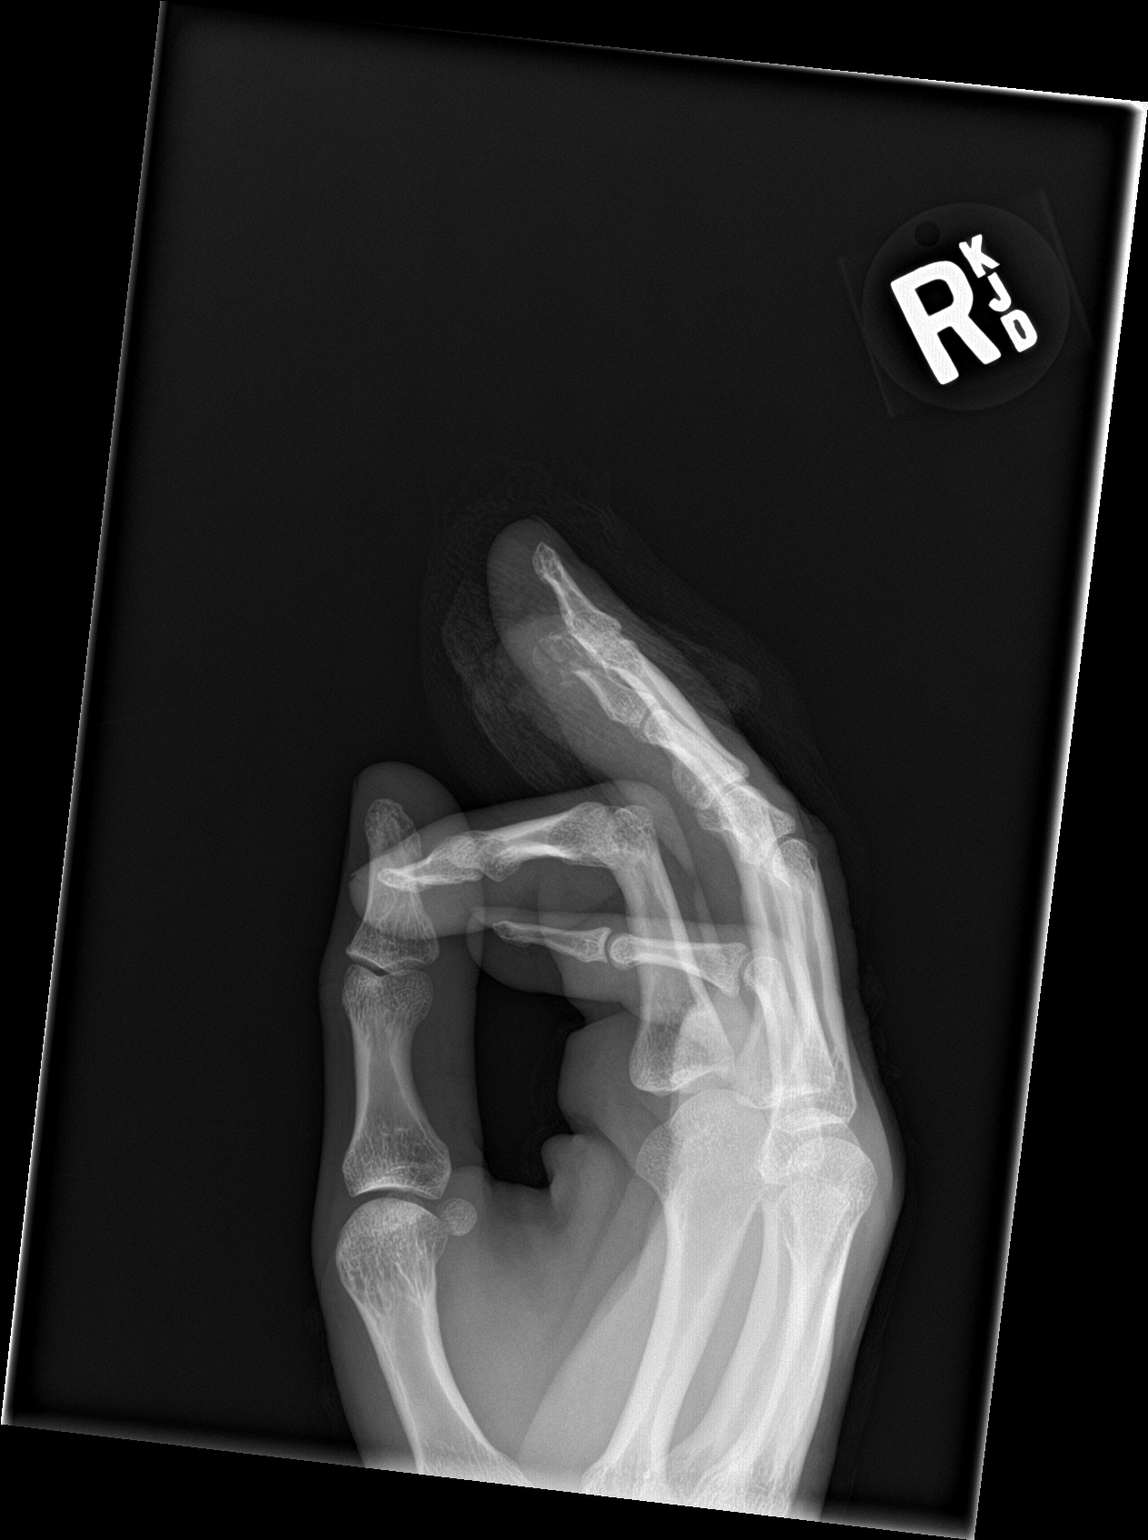

[3 of 3 positions shown; findings below may reference images not displayed]

FINDINGS: Comminuted acute fracture of the fourth distal phalanx tuft and
distal shaft with soft tissue laceration. No joint dislocation. No
other fracture identified.
IMPRESSION: Comminuted acute fracture of the fourth distal phalanx tuft and
distal shaft with soft tissue laceration. No joint dislocation.

By: Evome Zenullahi M.D.

## 2019-11-04 ENCOUNTER — Ambulatory Visit: Payer: Self-pay | Admitting: Family Medicine

## 2024-05-06 ENCOUNTER — Ambulatory Visit: Payer: Self-pay | Admitting: Family Medicine

## 2024-06-25 ENCOUNTER — Ambulatory Visit (HOSPITAL_BASED_OUTPATIENT_CLINIC_OR_DEPARTMENT_OTHER): Admit: 2024-06-25 | Discharge: 2024-06-25 | Disposition: A | Discharge status: Home / Self Care | Admitting: Radiology

## 2024-06-25 ENCOUNTER — Encounter (HOSPITAL_BASED_OUTPATIENT_CLINIC_OR_DEPARTMENT_OTHER): Payer: Self-pay

## 2024-06-25 ENCOUNTER — Ambulatory Visit (INDEPENDENT_AMBULATORY_CARE_PROVIDER_SITE_OTHER): Admitting: Radiology

## 2024-06-25 ENCOUNTER — Ambulatory Visit (HOSPITAL_BASED_OUTPATIENT_CLINIC_OR_DEPARTMENT_OTHER): Admission: EM | Admit: 2024-06-25 | Discharge: 2024-06-25 | Disposition: A | Discharge status: Home / Self Care

## 2024-06-25 DIAGNOSIS — N201 Calculus of ureter: Secondary | ICD-10-CM

## 2024-06-25 DIAGNOSIS — H6123 Impacted cerumen, bilateral: Secondary | ICD-10-CM

## 2024-06-25 DIAGNOSIS — R1011 Right upper quadrant pain: Secondary | ICD-10-CM

## 2024-06-25 DIAGNOSIS — N1330 Unspecified hydronephrosis: Secondary | ICD-10-CM

## 2024-06-25 DIAGNOSIS — R112 Nausea with vomiting, unspecified: Secondary | ICD-10-CM

## 2024-06-25 DIAGNOSIS — R1031 Right lower quadrant pain: Secondary | ICD-10-CM

## 2024-06-25 DIAGNOSIS — R10A1 Flank pain, right side: Secondary | ICD-10-CM

## 2024-06-25 DIAGNOSIS — R1032 Left lower quadrant pain: Secondary | ICD-10-CM

## 2024-06-25 HISTORY — DX: Anxiety disorder, unspecified: F41.9

## 2024-06-25 HISTORY — DX: Depression, unspecified: F32.A

## 2024-06-25 MED ORDER — TAMSULOSIN HCL 0.4 MG PO CAPS: 0.4000 mg | ORAL_CAPSULE | Freq: Every day | ORAL | 0 refills | Status: AC

## 2024-06-25 MED ORDER — OXYCODONE HCL 5 MG PO TABS: 5.0000 mg | ORAL_TABLET | Freq: Four times a day (QID) | ORAL | 0 refills | Status: AC | PRN

## 2024-06-25 NOTE — ED Triage Notes (Signed)
 Patient presents to [facility] for lower back pain since last Thursday. Seen in the ED on Sunday. He was prescribed Flomax, Zofran , and Oxy. States pain has worsened moving from bladder to flank area. Appt scheduled next Thursday for urology. Requesting work note to be out until next Thursday. He also reports voiding some, but believes he is retaining urine.   Denies fever.

## 2024-06-25 NOTE — ED Provider Notes (Addendum)
 Derek Quinn [facility]    CSN: [id number] Arrival date & time: 06/25/24  0859      History   Chief Complaint Chief Complaint  Patient presents with   Flank Pain    HPI Derek Quinn is a 33 y.o. male.   33 year old male who was seen at [facility] emergency department on 06/20/2024 for abdominal pain with nausea and vomiting.  His pain started on 06/18/2024 or earlier.  CT scan showed a right ureteral stone mid ureter that was 2 to 3 mm in size.  He was started on Zofran  for nausea and vomiting, oxycodone  (5 mg, #10) for pain and tamsulosin (0.4 mg, #10) to try to help pass the stone.  He has an appointment with Germain Brothers, MD, on 07/01/2024.  He does not feel able to work due to pain and nausea and vomiting.  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  He is also concerned that he might be retaining urine.     Flank Pain Associated symptoms include abdominal pain. Pertinent negatives include no chest pain.    Past Medical History:  Diagnosis Date   Anxiety    Depression     There are no active problems to display for this patient.   Past Surgical History:  Procedure Laterality Date   I & D EXTREMITY Right 11/29/2017   Procedure: IRRIGATION AND DEBRIDEMENT RING FINGER REPAIR;  Surgeon: Shari Easter, MD;  Location: [facility] OR;  Service: Orthopedics;  Laterality: Right;   OPEN REDUCTION INTERNAL FIXATION (ORIF) FINGER WITH RADIAL BONE GRAFT Right 11/29/2017   Procedure: OPEN REDUCTION INTERNAL FIXATION (ORIF) FINGER;  Surgeon: Shari Easter, MD;  Location: [facility] OR;  Service: Orthopedics;  Laterality: Right;       Home Medications    Prior to Admission medications   Medication Sig Start Date End Date Taking? Authorizing Provider  DULoxetine (CYMBALTA) 30 MG capsule Take 60 mg by mouth daily. 05/17/24  Yes [provider]  ondansetron  (ZOFRAN -ODT) 4 MG disintegrating tablet SMARTSIG:4 Milligram(s) By Mouth Every 6 Hours PRN 06/20/24  Yes [provider]  oxyCODONE  (OXY IR/ROXICODONE ) 5 MG immediate release tablet Take 1 tablet (5 mg total) by mouth every 6 (six) hours as needed for severe pain (pain score 7-10). 06/25/24   Ival Domino, FNP  tamsulosin (FLOMAX) 0.4 MG CAPS capsule Take 1 capsule (0.4 mg total) by mouth daily after supper. 06/25/24   Ival Domino, FNP    Family History History reviewed. No pertinent family history.  Social History Social History   Tobacco Use   Smoking status: Every Day    Types: Cigarettes   Smokeless tobacco: Never  Vaping Use   Vaping status: Every Day  Substance Use Topics   Alcohol use: Yes    Comment: occasionally   Drug use: Yes    Types: Methamphetamines    Comment: cbd oild     Allergies   Patient has no known allergies.   Review of Systems Review of Systems  Constitutional:  Negative for chills and fever.  HENT:  Negative for ear pain and sore throat.   Eyes:  Negative for pain and visual disturbance.  Respiratory:  Negative for cough.   Cardiovascular:  Negative for chest pain and palpitations.  Gastrointestinal:  Positive for abdominal pain, nausea and vomiting. Negative for constipation and diarrhea.  Genitourinary:  Positive for flank pain. Negative for dysuria and hematuria.  Musculoskeletal:  Negative for arthralgias and back pain.  Skin:  Negative for color  change and rash.  Neurological:  Negative for seizures and syncope.  All other systems reviewed and are negative.    Physical Exam Triage Vital Signs ED Triage Vitals  Encounter Vitals Group     BP 06/25/24 0919 121/85     Girls Systolic BP Percentile --      Girls Diastolic BP Percentile --      Boys Systolic BP Percentile --      Boys Diastolic BP Percentile --      Pulse Rate 06/25/24 0919 86     Resp 06/25/24 0919 18     Temp 06/25/24 0919 98.6 F (37 C)     Temp Source 06/25/24 0919 Oral     SpO2 --      Weight --      Height --      Head Circumference --      Peak Flow --      Pain Score  06/25/24 0911 2     Pain Loc --      Pain Education --      Exclude from Growth Chart --    No data found.  Updated Vital Signs BP 121/85 (BP Location: Right Arm)   Pulse 86   Temp 98.6 F (37 C) (Oral)   Resp 18   Visual Acuity Right Eye Distance:   Left Eye Distance:   Bilateral Distance:    Right Eye Near:   Left Eye Near:    Bilateral Near:     Physical Exam Vitals and nursing note reviewed.  Constitutional:      General: He is not in acute distress.    Appearance: He is well-developed. He is not ill-appearing or toxic-appearing.  HENT:     Head: Normocephalic and atraumatic.     Right Ear: Hearing and external ear normal. There is impacted cerumen.     Left Ear: Hearing and external ear normal. There is impacted cerumen.     Ears:     Comments: Both ears are completely blocked with wax.    Nose: No congestion or rhinorrhea.     Right Sinus: No maxillary sinus tenderness or frontal sinus tenderness.     Left Sinus: No maxillary sinus tenderness or frontal sinus tenderness.     Mouth/Throat:     Lips: Pink.     Mouth: Mucous membranes are moist.     Pharynx: Uvula midline. No oropharyngeal exudate or posterior oropharyngeal erythema.     Tonsils: No tonsillar exudate.  Eyes:     Conjunctiva/sclera: Conjunctivae normal.     Pupils: Pupils are equal, round, and reactive to light.  Cardiovascular:     Rate and Rhythm: Normal rate and regular rhythm.     Heart sounds: S1 normal and S2 normal. No murmur heard. Pulmonary:     Effort: Pulmonary effort is normal. No respiratory distress.     Breath sounds: Normal breath sounds. No decreased breath sounds, wheezing, rhonchi or rales.  Abdominal:     General: Bowel sounds are normal.     Palpations: Abdomen is soft.     Tenderness: There is abdominal tenderness (Mild to moderate abdominal pain) in the right upper quadrant, right lower quadrant, suprapubic area and left lower quadrant. There is right CVA tenderness.  There is no left CVA tenderness, guarding or rebound. Negative signs include Murphy's sign, Rovsing's sign and McBurney's sign.  Musculoskeletal:        General: No swelling.     Cervical back: Neck supple.  Lymphadenopathy:     Head:     Right side of head: No submental, submandibular, tonsillar, preauricular or posterior auricular adenopathy.     Left side of head: No submental, submandibular, tonsillar, preauricular or posterior auricular adenopathy.     Cervical: No cervical adenopathy.     Right cervical: No superficial cervical adenopathy.    Left cervical: No superficial cervical adenopathy.  Skin:    General: Skin is warm and dry.     Capillary Refill: Capillary refill takes less than 2 seconds.     Findings: No rash.  Neurological:     Mental Status: He is alert and oriented to person, place, and time.  Psychiatric:        Mood and Affect: Mood normal.      UC Treatments / Results  Labs (all labs ordered are listed, but only abnormal results are displayed) Labs Reviewed - No data to display  EKG   Radiology US  Renal Result Date: 06/25/2024 CLINICAL DATA:  Right flank pain. EXAM: RENAL / URINARY TRACT ULTRASOUND COMPLETE COMPARISON:  June 20, 2024. FINDINGS: Right Kidney: Renal measurements: 10.6 x 5.1 x 4.7 cm = volume: 128 mL. Echogenicity within normal limits. No mass or hydronephrosis visualized. Left Kidney: Renal measurements: 10.7 x 4.6 x 4.3 cm = volume: 109 mL. Echogenicity within normal limits. No mass or hydronephrosis visualized. Bladder: Appears normal for degree of bladder distention. Other: None. IMPRESSION: Normal renal ultrasound. Electronically Signed   By: Lynwood Landy Raddle M.D.   On: 06/25/2024 11:26   DG Abd 1 View Result Date: 06/25/2024 CLINICAL DATA:  Right ureteral calculus. EXAM: ABDOMEN - 1 VIEW COMPARISON:  CT abdomen pelvis at [facility] 06/20/2024 FINDINGS: Obstructing calculus in the mid right ureter seen by recent CT is difficult to  visualize by x-ray. There is suggestion of a roughly 3 mm calcifications superimposed on the right transverse process of L4 which may represent the ureteral calculus. No other abnormal calcifications identified. Bowel gas pattern is unremarkable. IMPRESSION: Possible 3 mm calculus in the mid right ureter superimposed on the right transverse process of L4. Electronically Signed   By: Marcey Moan M.D.   On: 06/25/2024 11:08        Procedures Procedures (including critical care time)  Medications Ordered in UC Medications - No data to display  Initial Impression / Assessment and Plan / UC Course  I have reviewed the triage vital signs and the nursing notes.  Pertinent labs & imaging results that were available during my care of the patient were reviewed by me and considered in my medical decision making (see chart for details).  Plan of Care: Right ureteral stone with previous hydronephrosis and abdominal pain: I was not able to view the stone on the x-ray but radiology believes the stone is still in the right ureter near the L4 vertebral body.  Right renal ultrasound is negative for any hydronephrosis.  Bladder was empty.  Continue tamsulosin or Flomax, 0.4 mg, 1 pill nightly to help pass kidney stone.  Try to intake 3 or 4 L of fluids every day.  Use ondansetron  4 mg melt on tongue pill every 8 hours if needed for nausea.  Use oxycodone , 5 mg, every 6 hours if needed for pain.  Strain urine and try to catch stone.  See primary care on Tuesday, 06/29/2024 and see urology on Thursday, 07/01/2024.  Follow-up here if needed.  I reviewed the plan of care with the patient and/or the patient's guardian.  The patient and/or guardian had time to ask questions and acknowledged that the questions were answered.  I provided instruction on symptoms or reasons to return here or to go to an ER, if symptoms/condition did not improve, worsened or if new symptoms occurred.  Final Clinical Impressions(s) / UC  Diagnoses   Final diagnoses:  Right ureteral stone  Nausea and vomiting, unspecified vomiting type  Right lower quadrant abdominal pain  Right upper quadrant abdominal pain  Left lower quadrant abdominal pain  Hydronephrosis of right kidney  Bilateral impacted cerumen     Discharge Instructions      Right ureteral stone with previous hydronephrosis and abdominal pain: Unable to view the stone on the x-ray.  Right renal ultrasound is negative for any hydronephrosis.  Bladder was empty.  Continue tamsulosin or Flomax, 0.4 mg, 1 pill nightly to help pass kidney stone.  Try to intake 3 or 4 L of fluids every day.  Use ondansetron  4 mg melt on tongue pill every 8 hours if needed for nausea.  Use oxycodone , 5 mg, every 6 hours if needed for pain.  Strain urine and try to catch stone.  See primary care on Tuesday, 06/29/2024 and see urology on Thursday, 07/01/2024.  Follow-up here if needed.     ED Prescriptions     Medication Sig Dispense Auth. Provider   tamsulosin (FLOMAX) 0.4 MG CAPS capsule Take 1 capsule (0.4 mg total) by mouth daily after supper. 30 capsule Ival Domino, FNP   oxyCODONE  (OXY IR/ROXICODONE ) 5 MG immediate release tablet Take 1 tablet (5 mg total) by mouth every 6 (six) hours as needed for severe pain (pain score 7-10). 10 tablet Demetres Prochnow, FNP      I have reviewed the PDMP during this encounter.   Ival Domino, FNP 06/25/24 1127    Ival Domino, FNP 06/25/24 [phone]

## 2024-06-25 NOTE — Discharge Instructions (Addendum)
 Right ureteral stone with previous hydronephrosis and abdominal pain: Unable to view the stone on the x-ray.  Right renal ultrasound is negative for any hydronephrosis.  Bladder was empty.  Continue tamsulosin or Flomax, 0.4 mg, 1 pill nightly to help pass kidney stone.  Try to intake 3 or 4 L of fluids every day.  Use ondansetron  4 mg melt on tongue pill every 8 hours if needed for nausea.  Use oxycodone , 5 mg, every 6 hours if needed for pain.  Strain urine and try to catch stone.  See primary care on Tuesday, 06/29/2024 and see urology on Thursday, 07/01/2024.  Follow-up here if needed.

## 2024-06-29 ENCOUNTER — Encounter (HOSPITAL_BASED_OUTPATIENT_CLINIC_OR_DEPARTMENT_OTHER): Payer: Self-pay | Admitting: Family Medicine

## 2024-06-29 ENCOUNTER — Ambulatory Visit (HOSPITAL_BASED_OUTPATIENT_CLINIC_OR_DEPARTMENT_OTHER): Admitting: Family Medicine

## 2024-06-29 VITALS — BP 121/85 | HR 74 | Temp 98.7°F | Resp 16 | Ht 73.0 in | Wt 212.2 lb

## 2024-06-29 DIAGNOSIS — N200 Calculus of kidney: Secondary | ICD-10-CM | POA: Insufficient documentation

## 2024-06-29 DIAGNOSIS — Z720 Tobacco use: Secondary | ICD-10-CM | POA: Insufficient documentation

## 2024-06-29 DIAGNOSIS — F419 Anxiety disorder, unspecified: Secondary | ICD-10-CM | POA: Diagnosis not present

## 2024-06-29 MED ORDER — DULOXETINE HCL 30 MG PO CPEP: 60.0000 mg | ORAL_CAPSULE | Freq: Every day | ORAL | 2 refills | Status: DC

## 2024-06-29 MED ORDER — DULOXETINE HCL 30 MG PO CPEP: 30.0000 mg | ORAL_CAPSULE | Freq: Every day | ORAL | 2 refills | Status: AC

## 2024-06-29 NOTE — Assessment & Plan Note (Signed)
 MDQ completed and scanned.  Mood disorder appears somewhat unlikely.  I encouraged counseling which he agrees to consider.  Increase his Duloxetine to 90mg  daily for now with meals.  Reevaluate next month.  Urged to update us  on the portal before then if problems.

## 2024-06-29 NOTE — Assessment & Plan Note (Signed)
 Obviously urged him to stop this as well as his THC.  Will focus on this more at his next visit here.

## 2024-06-29 NOTE — Progress Notes (Signed)
 Established Patient Office Visit  Subjective   Patient ID: Derek Quinn, male    DOB: [birth year]  Age: 33 y.o. MRN: [id number]  Chief Complaint  Patient presents with   Establish Care    Establish care     F/u as above.  Known to our urgent care but new to my practice.  Has been struggling with a kidney stone for some time.  He has an upcoming appt with [facility].  His anxiety and temper remain a problem.  Some relief with the Duloxetine, but its been somewhat limited.  He notes a strong FH of Mood disorder and admits occasional mood swings.    Past Medical History:  Diagnosis Date   Anxiety    Kidney stone    Tobacco abuse     Outpatient Encounter Medications as of 06/29/2024  Medication Sig   ondansetron  (ZOFRAN -ODT) 4 MG disintegrating tablet SMARTSIG:4 Milligram(s) By Mouth Every 6 Hours PRN   oxyCODONE  (OXY IR/ROXICODONE ) 5 MG immediate release tablet Take 1 tablet (5 mg total) by mouth every 6 (six) hours as needed for severe pain (pain score 7-10).   tamsulosin (FLOMAX) 0.4 MG CAPS capsule Take 1 capsule (0.4 mg total) by mouth daily after supper.   [DISCONTINUED] DULoxetine (CYMBALTA) 30 MG capsule Take 60 mg by mouth daily.   DULoxetine (CYMBALTA) 30 MG capsule Take 1 capsule (30 mg total) by mouth daily.   [DISCONTINUED] DULoxetine (CYMBALTA) 30 MG capsule Take 2 capsules (60 mg total) by mouth daily.   [DISCONTINUED] DULoxetine (CYMBALTA) 30 MG capsule Take 2 capsules (60 mg total) by mouth daily.   No facility-administered encounter medications on file as of 06/29/2024.    Social History   Tobacco Use   Smoking status: Every Day    Types: Cigarettes   Smokeless tobacco: Never  Vaping Use   Vaping status: Every Day  Substance Use Topics   Alcohol use: Not Currently   Drug use: Yes    Types: Marijuana    Comment: Occasional, which was discouraged      Review of Systems  Constitutional:  Negative for malaise/fatigue and weight loss.   Cardiovascular:  Negative for chest pain and palpitations.  Neurological:  Negative for dizziness.  Psychiatric/Behavioral:  Negative for depression, hallucinations, memory loss, substance abuse and suicidal ideas. The patient is nervous/anxious. The patient does not have insomnia.       Objective:     BP 121/85 (BP Location: Right Arm, Patient Position: Sitting, Cuff Size: Large)   Pulse 74   Temp 98.7 F (37.1 C) (Oral)   Resp 16   Ht 6' 1 (1.854 m)   Wt 212 lb 3.2 oz (96.3 kg)   SpO2 95%   BMI 28.00 kg/m    Physical Exam Constitutional:      General: He is not in acute distress.    Appearance: Normal appearance.  HENT:     Head: Normocephalic.  Neck:     Vascular: No carotid bruit.  Cardiovascular:     Rate and Rhythm: Normal rate and regular rhythm.     Pulses: Normal pulses.     Heart sounds: Normal heart sounds.  Pulmonary:     Effort: Pulmonary effort is normal.     Breath sounds: Normal breath sounds.  Abdominal:     General: Bowel sounds are normal.     Palpations: Abdomen is soft.     Tenderness: There is no abdominal tenderness.  Musculoskeletal:     Cervical back:  Neck supple. No tenderness.     Right lower leg: No edema.     Left lower leg: No edema.  Neurological:     Mental Status: He is alert.  Psychiatric:        Behavior: Behavior normal.        Thought Content: Thought content normal.     Comments: Somewhat anxious      No results found for any visits on 06/29/24.    The ASCVD Risk score (Arnett DK, et al., 2019) failed to calculate for the following reasons:   The 2019 ASCVD risk score is only valid for ages 20 to 16    Assessment & Plan:  Kidney stone Assessment & Plan: F/u with [facility] as directed.   Anxiety Assessment & Plan: MDQ completed and scanned.  Mood disorder appears somewhat unlikely.  I encouraged counseling which he agrees to consider.  Increase his Duloxetine to 90mg  daily for now with meals.   Reevaluate next month.  Urged to update us  on the portal before then if problems.  Orders: -     DULoxetine HCl; Take 1 capsule (30 mg total) by mouth daily.  Dispense: 30 capsule; Refill: 2  Tobacco abuse Assessment & Plan: Obviously urged him to stop this as well as his THC.  Will focus on this more at his next visit here.     Return in about 4 weeks (around 07/27/2024) for chronic follow-up.    REDDING PONCE NORLEEN FALCON., MD

## 2024-06-29 NOTE — Assessment & Plan Note (Signed)
 F/u with [facility] as directed.

## 2024-07-27 ENCOUNTER — Ambulatory Visit (HOSPITAL_BASED_OUTPATIENT_CLINIC_OR_DEPARTMENT_OTHER): Admitting: Family Medicine

## 2024-07-27 ENCOUNTER — Encounter (HOSPITAL_BASED_OUTPATIENT_CLINIC_OR_DEPARTMENT_OTHER): Payer: Self-pay | Admitting: Family Medicine

## 2024-07-27 VITALS — BP 123/81 | HR 72 | Temp 97.8°F | Resp 16 | Wt 208.9 lb

## 2024-07-27 DIAGNOSIS — Z720 Tobacco use: Secondary | ICD-10-CM | POA: Diagnosis not present

## 2024-07-27 DIAGNOSIS — F419 Anxiety disorder, unspecified: Secondary | ICD-10-CM | POA: Diagnosis not present

## 2024-07-27 MED ORDER — DULOXETINE HCL 60 MG PO CPEP: 60.0000 mg | ORAL_CAPSULE | Freq: Every day | ORAL | 1 refills | Status: AC

## 2024-07-27 MED ORDER — DULOXETINE HCL 30 MG PO CPEP: 30.0000 mg | ORAL_CAPSULE | Freq: Every day | ORAL | 1 refills | Status: DC

## 2024-07-27 NOTE — Progress Notes (Signed)
 Established Patient Office Visit  Subjective   Patient ID: Derek Quinn, male    DOB: 1990-10-05  Age: 33 y.o. MRN: [id number]  Chief Complaint  Patient presents with   Follow-up    Follow-up    F/u as above.  Please see last note for details.  His kidney stone has been appropriately treated.  His anxiety remains an issue and his Duloxetine was previously increased to 90mg  daily.  He is considering counseling which we again reviewed today.  Some marital discord noted.    Past Medical History:  Diagnosis Date   Anxiety    Kidney stone    Tobacco abuse     Outpatient Encounter Medications as of 07/27/2024  Medication Sig   DULoxetine (CYMBALTA) 30 MG capsule Take 1 capsule (30 mg total) by mouth daily.   DULoxetine (CYMBALTA) 60 MG capsule Take 1 capsule (60 mg total) by mouth daily.   [DISCONTINUED] DULoxetine (CYMBALTA) 30 MG capsule Take 1 capsule (30 mg total) by mouth daily.   ondansetron  (ZOFRAN -ODT) 4 MG disintegrating tablet SMARTSIG:4 Milligram(s) By Mouth Every 6 Hours PRN (Patient not taking: Reported on 07/27/2024)   oxyCODONE  (OXY IR/ROXICODONE ) 5 MG immediate release tablet Take 1 tablet (5 mg total) by mouth every 6 (six) hours as needed for severe pain (pain score 7-10). (Patient not taking: Reported on 07/27/2024)   tamsulosin (FLOMAX) 0.4 MG CAPS capsule Take 1 capsule (0.4 mg total) by mouth daily after supper. (Patient not taking: Reported on 07/27/2024)   No facility-administered encounter medications on file as of 07/27/2024.    Social History   Tobacco Use   Smoking status: Every Day    Types: Cigarettes   Smokeless tobacco: Never  Vaping Use   Vaping status: Every Day  Substance Use Topics   Alcohol use: Not Currently   Drug use: Yes    Types: Marijuana    Comment: Occasional, which was discouraged      Review of Systems  Constitutional:  Negative for malaise/fatigue and weight loss.  Cardiovascular:  Negative for chest pain and  palpitations.  Neurological:  Negative for dizziness.  Psychiatric/Behavioral:  Positive for depression. Negative for hallucinations, memory loss, substance abuse and suicidal ideas. The patient is nervous/anxious. The patient does not have insomnia.       Objective:     BP 123/81 (Cuff Size: Normal)   Pulse 72   Temp 97.8 F (36.6 C) (Oral)   Resp 16   Wt 208 lb 14.4 oz (94.8 kg)   SpO2 98%   BMI 27.56 kg/m    Physical Exam Constitutional:      General: He is not in acute distress.    Appearance: Normal appearance.  HENT:     Head: Normocephalic.  Neck:     Vascular: No carotid bruit.  Cardiovascular:     Rate and Rhythm: Normal rate and regular rhythm.     Pulses: Normal pulses.     Heart sounds: Normal heart sounds.  Pulmonary:     Effort: Pulmonary effort is normal.     Breath sounds: Normal breath sounds.  Abdominal:     General: Bowel sounds are normal.     Palpations: Abdomen is soft.  Musculoskeletal:     Cervical back: Neck supple. No tenderness.     Right lower leg: No edema.     Left lower leg: No edema.  Neurological:     Mental Status: He is alert.  Psychiatric:  Thought Content: Thought content normal.     Comments: Mildly agitated at his spouse today      No results found for any visits on 07/27/24.    The ASCVD Risk score (Arnett DK, et al., 2019) failed to calculate for the following reasons:   The 2019 ASCVD risk score is only valid for ages 36 to 84    Assessment & Plan:  Tobacco abuse Assessment & Plan: Again discouraged his smoking, but he has no current interest in cessation.   Anxiety Assessment & Plan: Continue Duloxetine 90mg  total, with refills sent in.  He agrees to pursue counseling soon and update me in the coming weeks.  Orders: -     DULoxetine HCl; Take 1 capsule (60 mg total) by mouth daily.  Dispense: 90 capsule; Refill: 1  I personally spent a total of 25 minutes in the care of the patient today including  performing a medically appropriate exam/evaluation, counseling and educating, documenting clinical information in the EHR, and communicating results.   Return in about 3 months (around 10/27/2024) for chronic follow-up.    REDDING PONCE NORLEEN FALCON., MD

## 2024-07-27 NOTE — Assessment & Plan Note (Signed)
 Continue Duloxetine 90mg  total, with refills sent in.  He agrees to pursue counseling soon and update me in the coming weeks.

## 2024-07-27 NOTE — Assessment & Plan Note (Signed)
 Again discouraged his smoking, but he has no current interest in cessation.

## 2024-08-06 ENCOUNTER — Encounter (HOSPITAL_BASED_OUTPATIENT_CLINIC_OR_DEPARTMENT_OTHER): Payer: Self-pay | Admitting: Family Medicine

## 2024-08-06 ENCOUNTER — Ambulatory Visit (INDEPENDENT_AMBULATORY_CARE_PROVIDER_SITE_OTHER): Admitting: Family Medicine

## 2024-08-06 ENCOUNTER — Ambulatory Visit: Payer: Self-pay

## 2024-08-06 VITALS — BP 138/87 | HR 105 | Temp 98.4°F | Resp 16 | Wt 208.7 lb

## 2024-08-06 DIAGNOSIS — R109 Unspecified abdominal pain: Secondary | ICD-10-CM | POA: Insufficient documentation

## 2024-08-06 DIAGNOSIS — R1031 Right lower quadrant pain: Secondary | ICD-10-CM

## 2024-08-06 DIAGNOSIS — Z720 Tobacco use: Secondary | ICD-10-CM | POA: Diagnosis not present

## 2024-08-06 LAB — POCT URINALYSIS DIPSTICK
Blood, UA: NEGATIVE
Glucose, UA: NEGATIVE
Ketones, UA: NEGATIVE
Nitrite, UA: NEGATIVE
Protein, UA: NEGATIVE
Spec Grav, UA: 1.02 (ref 1.010–1.025)
Urobilinogen, UA: >=8 U/dL — AB
pH, UA: 7.5 (ref 5.0–8.0)

## 2024-08-06 NOTE — Telephone Encounter (Signed)
 FYI Only or Action Required?: FYI only for provider: appointment scheduled on 08/06/24.  Patient was last seen in primary care on 07/27/2024 by Dottie Norleen PHEBE PONCE, MD.  Called Nurse Triage reporting Abdominal Pain.  Symptoms began yesterday.  Interventions attempted: Rest, hydration, or home remedies.  Symptoms are: unchanged.  Triage Disposition: See HCP Within 4 Hours (Or PCP Triage)  Patient/caregiver understands and will follow disposition?: Yes   Copied from CRM [id number]. Topic: Clinical - Red Word Triage >> Aug 06, 2024 10:11 AM Donee H wrote: [facility] that prompted transfer to Nurse Triage: Patient experiencing stomach pains. He states started last night but has gotten worse since he woke up this morning. He mention had kidney stones a while back and not sure if it's related to anything. Patient is wanting to be seen today. Reason for Disposition  [1] MILD-MODERATE pain AND [2] constant AND [3] present > 2 hours  Answer Assessment - Initial Assessment Questions 1. LOCATION: Where does it hurt?      Right side upper abdomen  2. RADIATION: Does the pain shoot anywhere else? (e.g., chest, back)     Denies  3. ONSET: When did the pain begin? (Minutes, hours or days ago)      overnight 4. SUDDEN: Gradual or sudden onset?     sudden 5. PATTERN Does the pain come and go, or is it constant?     constant 6. SEVERITY: How bad is the pain?  (e.g., Scale 1-10; mild, moderate, or severe)     2/10 7. RECURRENT SYMPTOM: Have you ever had this type of stomach pain before? If Yes, ask: When was the last time? and What happened that time?      Yes-when he kidney stone 8. CAUSE: What do you think is causing the stomach pain? (e.g., gallstones, recent abdominal surgery)     History of kidney stone this feels similar pain 9. RELIEVING/AGGRAVATING FACTORS: What makes it better or worse? (e.g., antacids, bending or twisting motion, bowel movement)     Worse when eating  oatmeal  10. OTHER SYMPTOMS: Do you have any other symptoms? (e.g., back pain, diarrhea, fever, urination pain, vomiting)       denies  Protocols used: Abdominal Pain - Male-A-AH

## 2024-08-06 NOTE — Assessment & Plan Note (Signed)
Again encouraged cessation 

## 2024-08-06 NOTE — Assessment & Plan Note (Signed)
 Differential is somewhat broad.  Possible mild Diverticulitis, etc.  Bland diet.  Take it easy over the weekend.  Motrin with meals prn.  Advised him to be rechecked over the weekend if he struggles.  He could eventually need antibiotics, imaging, etc.

## 2024-08-06 NOTE — Progress Notes (Signed)
 Established Patient Office Visit  Subjective   Patient ID: Derek Quinn, male    DOB: 06/12/1991  Age: 33 y.o. MRN: [id number]  Chief Complaint  Patient presents with   Abdominal Pain    Abdominal pain    He reports two days of RLQ abdominal pain.  BM's are regular, and he doubts constipation.  No fevers.  Mild nausea.  It doesn't seem to be triggered by any of his meds.  Maybe exacerbated by mechanical movements.  No dysuria or testicular tenderness.  Abdominal Pain Associated symptoms include nausea. Pertinent negatives include no constipation, diarrhea, fever, vomiting or weight loss.    Past Medical History:  Diagnosis Date   Anxiety    Kidney stone    Tobacco abuse     Outpatient Encounter Medications as of 08/06/2024  Medication Sig   DULoxetine (CYMBALTA) 30 MG capsule Take 1 capsule (30 mg total) by mouth daily.   DULoxetine (CYMBALTA) 60 MG capsule Take 1 capsule (60 mg total) by mouth daily.   ondansetron  (ZOFRAN -ODT) 4 MG disintegrating tablet SMARTSIG:4 Milligram(s) By Mouth Every 6 Hours PRN (Patient not taking: Reported on 07/27/2024)   oxyCODONE  (OXY IR/ROXICODONE ) 5 MG immediate release tablet Take 1 tablet (5 mg total) by mouth every 6 (six) hours as needed for severe pain (pain score 7-10). (Patient not taking: Reported on 07/27/2024)   tamsulosin (FLOMAX) 0.4 MG CAPS capsule Take 1 capsule (0.4 mg total) by mouth daily after supper. (Patient not taking: Reported on 07/27/2024)   No facility-administered encounter medications on file as of 08/06/2024.    Social History   Tobacco Use   Smoking status: Every Day    Types: Cigarettes   Smokeless tobacco: Never  Vaping Use   Vaping status: Every Day  Substance Use Topics   Alcohol use: Not Currently   Drug use: Yes    Types: Marijuana    Comment: Occasional, which was discouraged      Review of Systems  Constitutional:  Negative for diaphoresis, fever, malaise/fatigue and weight loss.   Respiratory:  Negative for cough, shortness of breath and wheezing.   Cardiovascular:  Negative for chest pain, palpitations, orthopnea, claudication, leg swelling and PND.  Gastrointestinal:  Positive for abdominal pain and nausea. Negative for blood in stool, constipation, diarrhea, heartburn and vomiting.      Objective:     BP 138/87 (Cuff Size: Normal)   Pulse (!) 105   Temp 98.4 F (36.9 C) (Oral)   Resp 16   Wt 208 lb 11.2 oz (94.7 kg)   SpO2 97%   BMI 27.53 kg/m    Physical Exam Constitutional:      General: He is not in acute distress.    Appearance: Normal appearance.  HENT:     Head: Normocephalic.  Neck:     Vascular: No carotid bruit.  Cardiovascular:     Rate and Rhythm: Normal rate and regular rhythm.     Pulses: Normal pulses.     Heart sounds: Normal heart sounds.  Pulmonary:     Effort: Pulmonary effort is normal.     Breath sounds: Normal breath sounds.  Abdominal:     General: Bowel sounds are normal.     Palpations: Abdomen is soft.     Tenderness: There is abdominal tenderness in the right lower quadrant. There is no guarding or rebound. Negative signs include McBurney's sign.     Comments: Tenderness is only mild  Genitourinary:    Comments: Rectal deferred  for now. Musculoskeletal:     Cervical back: Neck supple. No tenderness.     Right lower leg: No edema.     Left lower leg: No edema.  Neurological:     Mental Status: He is alert.      Results for orders placed or performed in visit on 08/06/24  POCT Urinalysis Dipstick  Result Value Ref Range   Color, UA orange    Clarity, UA other    Glucose, UA Negative Negative   Bilirubin, UA small    Ketones, UA negative    Spec Grav, UA 1.020 1.010 - 1.025   Blood, UA negative    pH, UA 7.5 5.0 - 8.0   Protein, UA Negative Negative   Urobilinogen, UA >=8.0 (A) 0.2 or 1.0 E.U./dL   Nitrite, UA negative    Leukocytes, UA Trace (A) Negative   Appearance orange    Odor n/a        The ASCVD Risk score (Arnett DK, et al., 2019) failed to calculate for the following reasons:   The 2019 ASCVD risk score is only valid for ages 46 to 64    Assessment & Plan:  Right lower quadrant abdominal pain Assessment & Plan: Differential is somewhat broad.  Possible mild Diverticulitis, etc.  Bland diet.  Take it easy over the weekend.  Motrin with meals prn.  Advised him to be rechecked over the weekend if he struggles.  He could eventually need antibiotics, imaging, etc.  Orders: -     Urinalysis, Routine w reflex microscopic -     POCT urinalysis dipstick  Tobacco abuse Assessment & Plan: Again encouraged cessation.     Return if symptoms worsen or fail to improve.    REDDING PONCE NORLEEN FALCON., MD

## 2024-08-07 LAB — URINALYSIS, ROUTINE W REFLEX MICROSCOPIC
Glucose, UA: NEGATIVE
Ketones, UA: NEGATIVE
Nitrite, UA: NEGATIVE
RBC, UA: NEGATIVE
Specific Gravity, UA: 1.022 (ref 1.005–1.030)
Urobilinogen, Ur: 2 mg/dL — ABNORMAL HIGH (ref 0.2–1.0)
pH, UA: 7.5 (ref 5.0–7.5)

## 2024-08-07 LAB — MICROSCOPIC EXAMINATION
Bacteria, UA: NONE SEEN
Casts: NONE SEEN /LPF

## 2024-08-09 ENCOUNTER — Ambulatory Visit (HOSPITAL_BASED_OUTPATIENT_CLINIC_OR_DEPARTMENT_OTHER): Payer: Self-pay | Admitting: Family Medicine

## 2024-10-27 ENCOUNTER — Ambulatory Visit (HOSPITAL_BASED_OUTPATIENT_CLINIC_OR_DEPARTMENT_OTHER): Admitting: Family Medicine
